# Patient Record
Sex: Male | Born: 1970 | Race: White | Hispanic: No | Marital: Married
Health system: Southern US, Community
[De-identification: ages and names within clinical notes are randomized; demographics above are authoritative.]

---

## 2018-01-18 ENCOUNTER — Encounter: Payer: Self-pay | Admitting: Emergency Medicine

## 2018-01-18 DIAGNOSIS — R300 Dysuria: Secondary | ICD-10-CM | POA: Diagnosis not present

## 2018-01-18 DIAGNOSIS — N200 Calculus of kidney: Secondary | ICD-10-CM | POA: Diagnosis not present

## 2018-01-18 DIAGNOSIS — R319 Hematuria, unspecified: Secondary | ICD-10-CM | POA: Diagnosis present

## 2018-01-18 DIAGNOSIS — R350 Frequency of micturition: Secondary | ICD-10-CM | POA: Diagnosis not present

## 2018-01-18 DIAGNOSIS — N390 Urinary tract infection, site not specified: Secondary | ICD-10-CM | POA: Insufficient documentation

## 2018-01-18 NOTE — ED Triage Notes (Signed)
Pt c/o hematuria, dysuria and frequent urination x1 week with bleeding starting today today.

## 2018-01-19 ENCOUNTER — Emergency Department: Payer: BLUE CROSS/BLUE SHIELD

## 2018-01-19 ENCOUNTER — Emergency Department
Admission: EM | Admit: 2018-01-19 | Discharge: 2018-01-19 | Disposition: A | Discharge status: Home / Self Care | Payer: BLUE CROSS/BLUE SHIELD | Attending: Emergency Medicine | Admitting: Emergency Medicine

## 2018-01-19 DIAGNOSIS — N390 Urinary tract infection, site not specified: Secondary | ICD-10-CM

## 2018-01-19 DIAGNOSIS — R319 Hematuria, unspecified: Secondary | ICD-10-CM

## 2018-01-19 DIAGNOSIS — N200 Calculus of kidney: Secondary | ICD-10-CM

## 2018-01-19 LAB — CBC
HEMATOCRIT: 44.8 % (ref 40.0–52.0)
HEMOGLOBIN: 15.9 g/dL (ref 13.0–18.0)
MCH: 31 pg (ref 26.0–34.0)
MCHC: 35.4 g/dL (ref 32.0–36.0)
MCV: 87.5 fL (ref 80.0–100.0)
Platelets: 315 10*3/uL (ref 150–440)
RBC: 5.12 MIL/uL (ref 4.40–5.90)
RDW: 12.8 % (ref 11.5–14.5)
WBC: 10.7 10*3/uL — AB (ref 3.8–10.6)

## 2018-01-19 LAB — BASIC METABOLIC PANEL
ANION GAP: 7 (ref 5–15)
BUN: 13 mg/dL (ref 6–20)
CO2: 24 mmol/L (ref 22–32)
Calcium: 9 mg/dL (ref 8.9–10.3)
Chloride: 106 mmol/L (ref 101–111)
Creatinine, Ser: 0.97 mg/dL (ref 0.61–1.24)
GFR calc non Af Amer: >60 mL/min (ref >60)
GLUCOSE: 112 mg/dL — AB (ref 65–99)
POTASSIUM: 3.8 mmol/L (ref 3.5–5.1)
Sodium: 137 mmol/L (ref 135–145)

## 2018-01-19 LAB — URINALYSIS, COMPLETE (UACMP) WITH MICROSCOPIC
Bilirubin Urine: NEGATIVE
GLUCOSE, UA: NEGATIVE mg/dL
Ketones, ur: NEGATIVE mg/dL
Leukocytes, UA: NEGATIVE
Nitrite: NEGATIVE
Protein, ur: 100 mg/dL — AB
Specific Gravity, Urine: 1.027 (ref 1.005–1.030)
pH: 6 (ref 5.0–8.0)

## 2018-01-19 MED ORDER — ONDANSETRON 4 MG PO TBDP: 4.0000 mg | ORAL_TABLET | Freq: Three times a day (TID) | ORAL | 0 refills | Status: AC | PRN

## 2018-01-19 MED ORDER — OXYCODONE-ACETAMINOPHEN 5-325 MG PO TABS: 1.0000 | ORAL_TABLET | ORAL | 0 refills | Status: AC | PRN

## 2018-01-19 MED ORDER — TAMSULOSIN HCL 0.4 MG PO CAPS: 0.4000 mg | ORAL_CAPSULE | Freq: Every day | ORAL | 0 refills | Status: AC

## 2018-01-19 MED ORDER — CIPROFLOXACIN HCL 500 MG PO TABS
500.0000 mg | ORAL_TABLET | Freq: Once | ORAL | Status: AC
  Administered 2018-01-19: 500 mg via ORAL
  Filled 2018-01-19: qty 1

## 2018-01-19 MED ORDER — CIPROFLOXACIN HCL 500 MG PO TABS: 500.0000 mg | ORAL_TABLET | Freq: Two times a day (BID) | ORAL | 0 refills | Status: AC

## 2018-01-19 NOTE — ED Provider Notes (Signed)
[facility] Emergency Department Provider Note   ____________________________________________   First MD Initiated Contact with Patient 01/19/18 0303     (approximate)  I have reviewed the triage vital signs and the nursing notes.   HISTORY  Chief Complaint Hematuria and Dysuria    HPI Billy Robbins is a 47 y.o. male who presents to the ED from home with a chief complaint of hematuria, dysuria and urinary frequency for the past week.  Patient has had similar symptoms previously; treated for prostatitis last fall.  Has had dysuria and frequency for the past week with bleeding which started today.  Denies blood clots.  Does not take anticoagulants.  Denies fever, chills, chest pain, shortness of breath, abdominal pain, nausea, vomiting.  Denies urethral discharge or concerns for STDs.  Denies recent travel or trauma.   Past medical history None  There are no active problems to display for this patient.   History reviewed. No pertinent surgical history.  Prior to Admission medications   Not on File    Allergies Patient has no known allergies.  History reviewed. No pertinent family history.  Social History Social History   Tobacco Use  . Smoking status: Not on file  Substance Use Topics  . Alcohol use: Not on file  . Drug use: Not on file  No recent alcohol use  Review of Systems  Constitutional: No fever/chills. Eyes: No visual changes. ENT: No sore throat. Cardiovascular: Denies chest pain. Respiratory: Denies shortness of breath. Gastrointestinal: No abdominal pain.  No nausea, no vomiting.  No diarrhea.  No constipation. Genitourinary: Positive for hematuria, frequency and dysuria. Musculoskeletal: Negative for back pain. Skin: Negative for rash. Neurological: Negative for headaches, focal weakness or numbness.   ____________________________________________   PHYSICAL EXAM:  VITAL SIGNS: ED Triage Vitals  Enc Vitals Group       BP 01/18/18 2350 134/83     Pulse Rate 01/18/18 2350 83     Resp 01/18/18 2350 16     Temp 01/18/18 2350 98.7 F (37.1 C)     Temp Source 01/18/18 2350 Oral     SpO2 01/18/18 2350 95 %     Weight 01/18/18 2340 220 lb (99.8 kg)     Height 01/18/18 2340 5\' 5"  (1.651 m)     Head Circumference --      Peak Flow --      Pain Score --      Pain Loc --      Pain Edu? --      Excl. in GC? --     Constitutional: Alert and oriented. Well appearing and in no acute distress. Eyes: Conjunctivae are normal. PERRL. EOMI. Head: Atraumatic. Nose: No congestion/rhinnorhea. Mouth/Throat: Mucous membranes are moist.  Oropharynx non-erythematous. Neck: No stridor.   Cardiovascular: Normal rate, regular rhythm. Grossly normal heart sounds.  Good peripheral circulation. Respiratory: Normal respiratory effort.  No retractions. Lungs CTAB. Gastrointestinal: Soft and nontender to light or deep palpation. No distention. No abdominal bruits. No CVA tenderness. Musculoskeletal: No lower extremity tenderness nor edema.  No joint effusions. Neurologic:  Normal speech and language. No gross focal neurologic deficits are appreciated. No gait instability. Skin:  Skin is warm, dry and intact. No rash noted. Psychiatric: Mood and affect are normal. Speech and behavior are normal.  ____________________________________________   LABS (all labs ordered are listed, but only abnormal results are displayed)  Labs Reviewed  URINALYSIS, COMPLETE (UACMP) WITH MICROSCOPIC - Abnormal; Notable for the following components:  Result Value   Color, Urine AMBER (*)    APPearance CLOUDY (*)    Hgb urine dipstick LARGE (*)    Protein, ur 100 (*)    Bacteria, UA RARE (*)    Squamous Epithelial / LPF 0-5 (*)    All other components within normal limits  BASIC METABOLIC PANEL - Abnormal; Notable for the following components:   Glucose, Bld 112 (*)    All other components within normal limits  CBC - Abnormal;  Notable for the following components:   WBC 10.7 (*)    All other components within normal limits   ____________________________________________  EKG  None ____________________________________________  RADIOLOGY  ED MD interpretation: 5 mm right ureteral stone without significant hydroureteronephrosis  Official radiology report(s): Ct Renal Stone Study  Result Date: 01/19/2018 CLINICAL DATA:  Initial evaluation for acute hematuria, unknown cause. EXAM: CT ABDOMEN AND PELVIS WITHOUT CONTRAST TECHNIQUE: Multidetector CT imaging of the abdomen and pelvis was performed following the standard protocol without IV contrast. COMPARISON:  None available. FINDINGS: Lower chest: Minimal scarring/atelectatic changes present within the lingula. Visualized lung bases are otherwise clear. Hepatobiliary: Limited noncontrast evaluation of the liver is unremarkable. Gallbladder normal. No biliary dilatation. Pancreas: Pancreas within normal limits. Spleen: Spleen within normal limits. Adrenals/Urinary Tract: Adrenal glands are normal. 5 mm stone seen lodged within the distal right ureter (series 2, image 73). Mild localized inflammatory stranding without significant hydronephrosis or hydroureter. No other radiopaque calculi seen within the right kidney or along the course of the right renal collecting system. No left-sided calculi identified. No left-sided hydronephrosis or hydroureter. Partially distended bladder within normal limits. No layering stones within the bladder lumen. Stomach/Bowel: Stomach within normal limits. No evidence for bowel obstruction. No acute inflammatory changes seen about the bowels. Vascular/Lymphatic: Intra-abdominal aorta of normal caliber. No adenopathy. Reproductive: Prostate within normal limits. Other: Fat containing left inguinal hernia without associated inflammation. Additional fat containing paraumbilical hernia noted as well. No free air or fluid. Musculoskeletal: No acute  osseus abnormality. No worrisome lytic or blastic osseous lesions. Moderate facet arthropathy noted within the lumbar spine. IMPRESSION: 1. 5 mm stone lodged within the distal right ureter without significant hydroureteronephrosis. No other radiopaque calculi identified. 2. No other acute intra-abdominal or pelvic process. 3. Fat containing paraumbilical and left inguinal hernias without associated inflammation. Electronically Signed   By: Rise Mu M.D.   On: 01/19/2018 04:03    ____________________________________________   PROCEDURES  Procedure(s) performed: None  Procedures  Critical Care performed: No  ____________________________________________   INITIAL IMPRESSION / ASSESSMENT AND PLAN / ED COURSE  As part of my medical decision making, I reviewed the following data within the electronic MEDICAL RECORD NUMBER Nursing notes reviewed and incorporated, Labs reviewed, Old chart reviewed, Radiograph reviewed, and Notes from prior ED visits   47 year old male who presents with hematuria, dysuria and frequency. Differential diagnosis includes, but is not limited to, acute appendicitis, renal colic, testicular torsion, urinary tract infection/pyelonephritis, prostatitis,  epididymitis, diverticulitis, small bowel obstruction or ileus, colitis, abdominal aortic aneurysm, gastroenteritis, hernia, etc.  Laboratory results unremarkable.  Urinalysis reveals TNTC RBC and WBC.  Will initiate antibiotic with Cipro and obtain CT renal colic study.  Patient without pain.  Clinical Course as of Jan 19 449  Sat Jan 19, 2018  8295 Updated patient on CT results.  Will discharge home on Percocet and Zofran as needed, Flomax daily x 14 days, Cipro twice daily x 7 days.  Follow-up with urology as needed.  Strict return precautions given.  Patient verbalizes understanding agrees with plan of care.   [JS]    Clinical Course User Index [JS] Irean Hong, MD      ____________________________________________   FINAL CLINICAL IMPRESSION(S) / ED DIAGNOSES  Final diagnoses:  Lower urinary tract infectious disease  Hematuria, unspecified type  Kidney stone     ED Discharge Orders    None       Note:  This document was prepared using Dragon voice recognition software and may include unintentional dictation errors.    Irean Hong, MD 01/19/18 [id number]

## 2018-01-19 NOTE — Discharge Instructions (Addendum)
1.  Take antibiotic as prescribed (Cipro 500 mg twice daily for 7 days). 2.  You may take medicines as needed for pain and nausea (Percocet/Zofran #30). 3.  Drink plenty of water filtered water daily. 4.  Take Flomax daily for 14 days. 5.  Return to the ER for worsening symptoms, persistent vomiting, difficulty breathing or other concerns.

## 2019-03-19 IMAGING — CT CT RENAL STONE PROTOCOL
2 of 4 series · 16 of 46 positions shown, 18 images · non-contrast
Comparison: None available.

CLINICAL DATA: Initial evaluation for acute hematuria, unknown
cause.

EXAM:
CT ABDOMEN AND PELVIS WITHOUT CONTRAST
TECHNIQUE: Multidetector CT imaging of the abdomen and pelvis was performed
following the standard protocol without IV contrast.

[Series 2: stone full standard · axial · 0.83mm/px · z∈[-870,-435]mm · 13 of 95 slices shown, 15 images]
[im 4/95  soft-tissue]
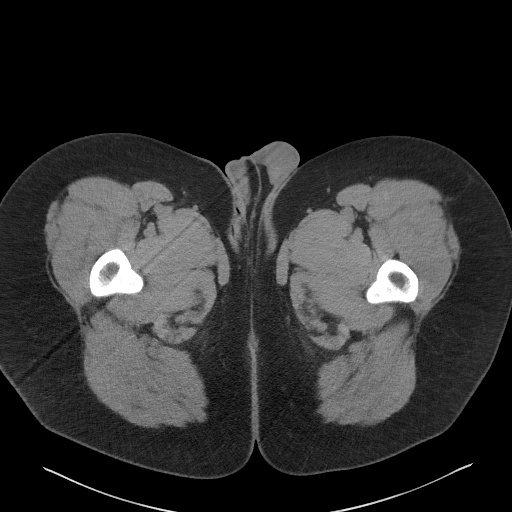
[im 4/95  bone]
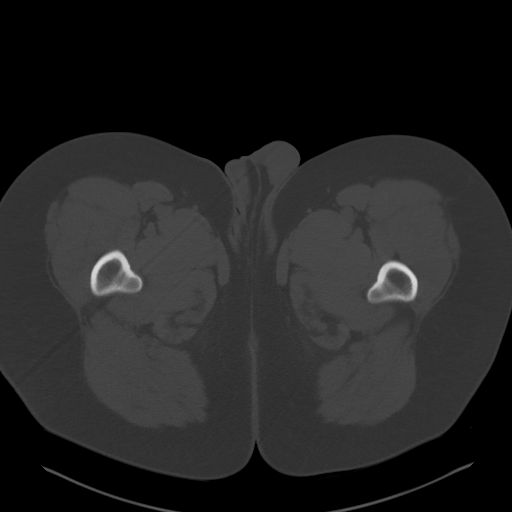
[im 12/95  soft-tissue]
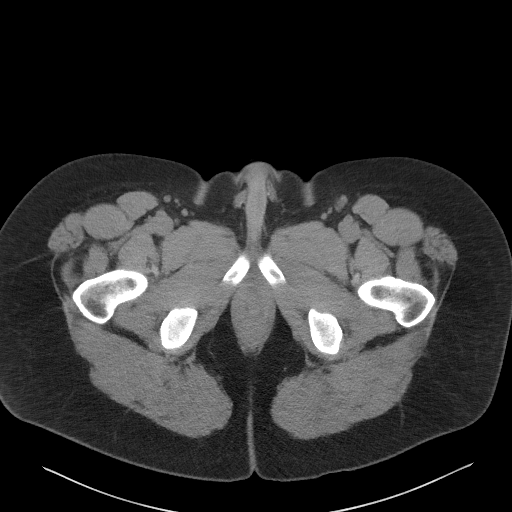
[im 20/95  soft-tissue]
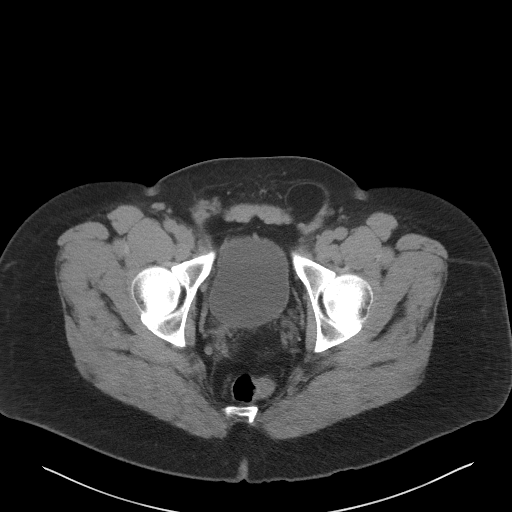
[im 28/95  soft-tissue]
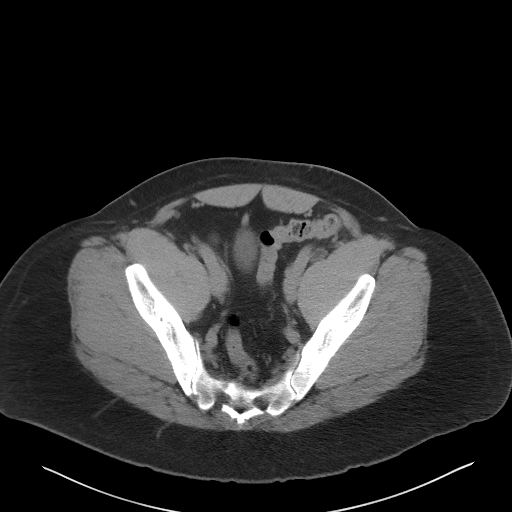
[im 32/95  soft-tissue]
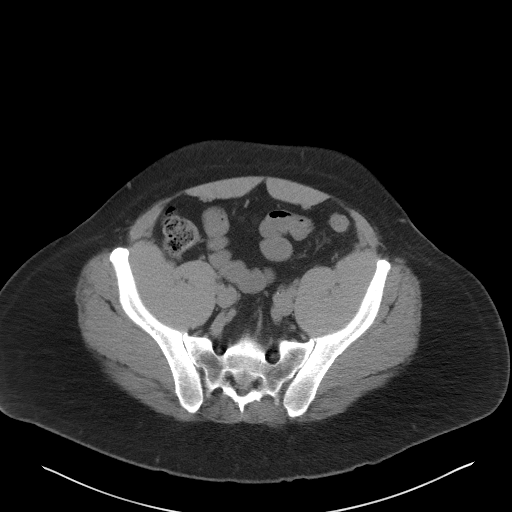
[im 40/95  soft-tissue]
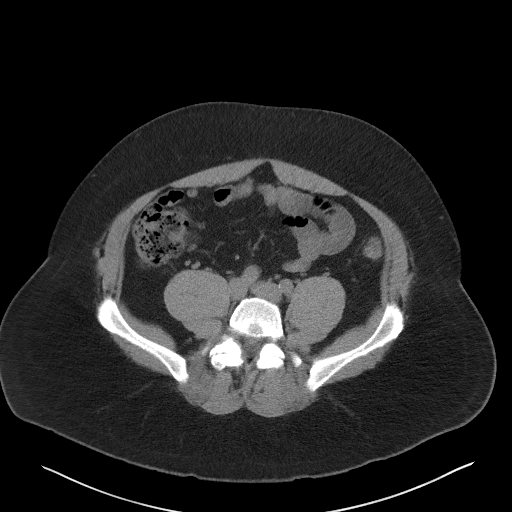
[im 48/95  soft-tissue]
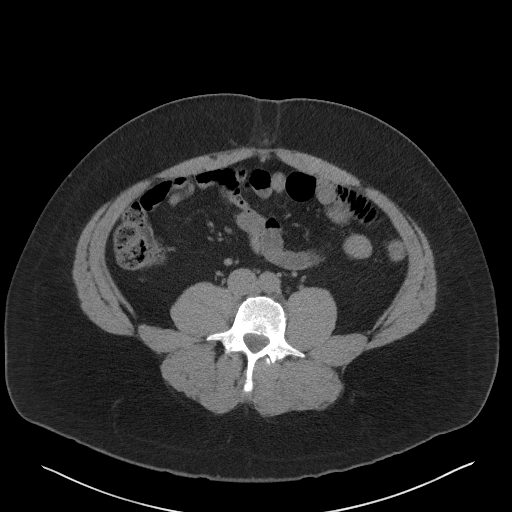
[im 55/95  soft-tissue]
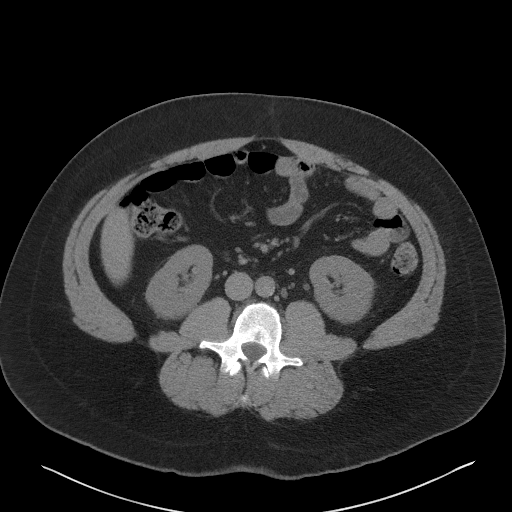
[im 63/95  soft-tissue]
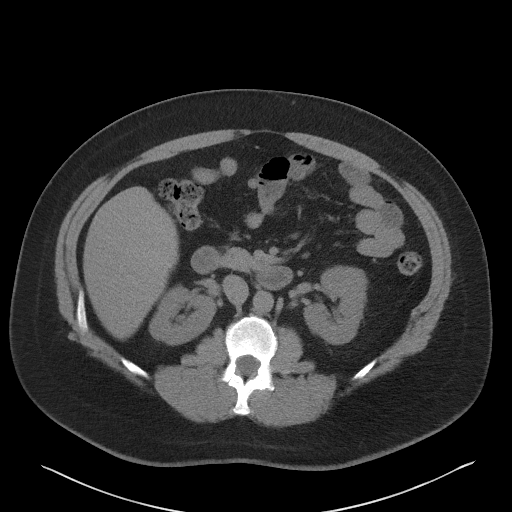
[im 63/95  bone]
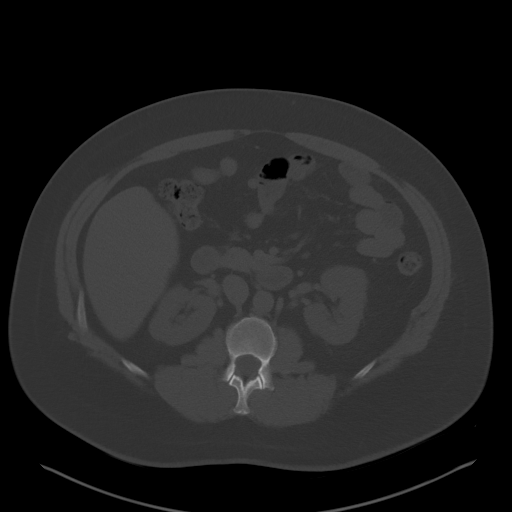
[im 67/95  soft-tissue]
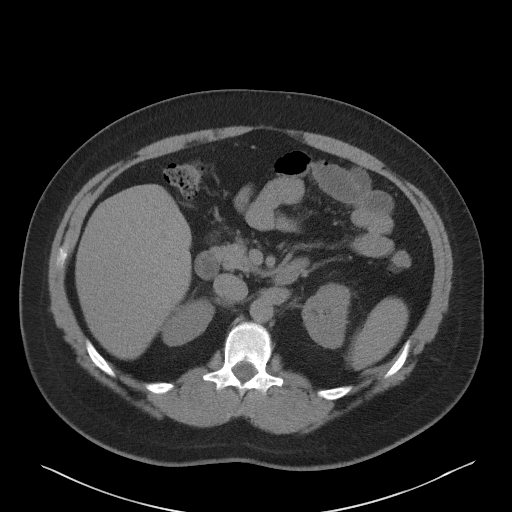
[im 75/95  soft-tissue]
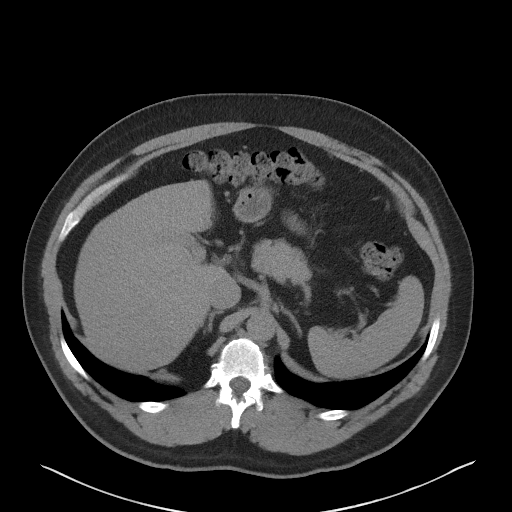
[im 83/95  soft-tissue]
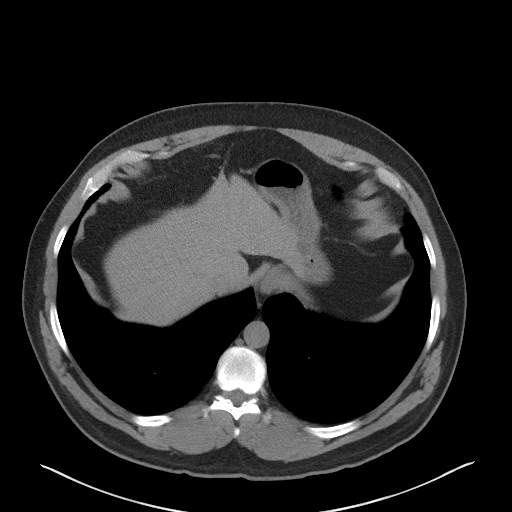
[im 91/95  soft-tissue]
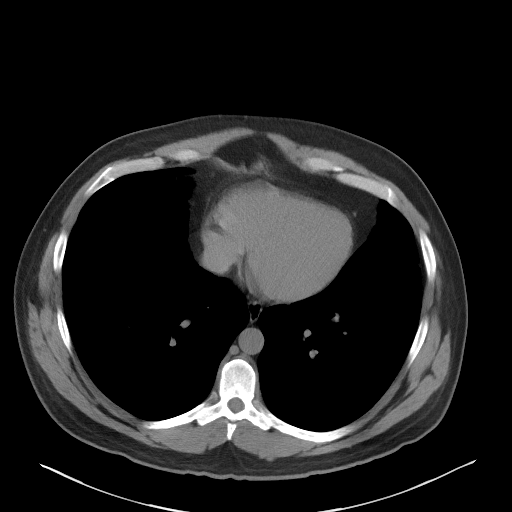

[Series 5: coronal · coronal · 0.74mm/px · 3 of 145 slices shown]
[im 49/145  soft-tissue]
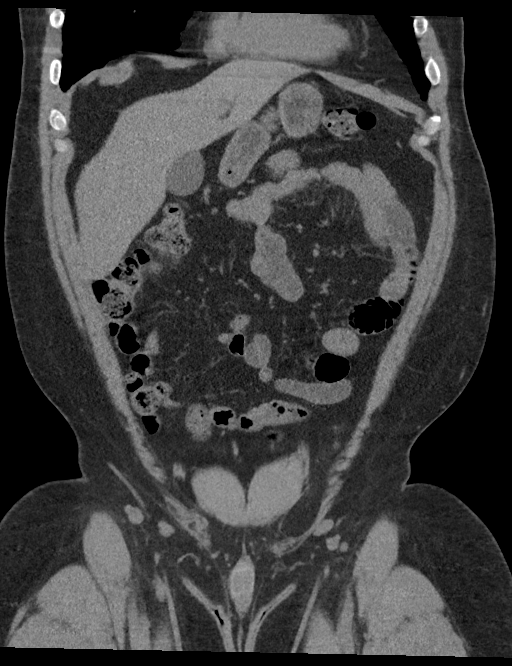
[im 65/145  soft-tissue]
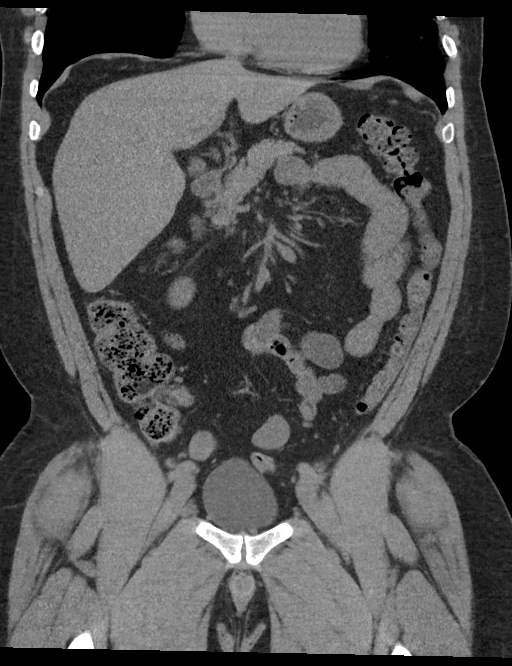
[im 81/145  soft-tissue]
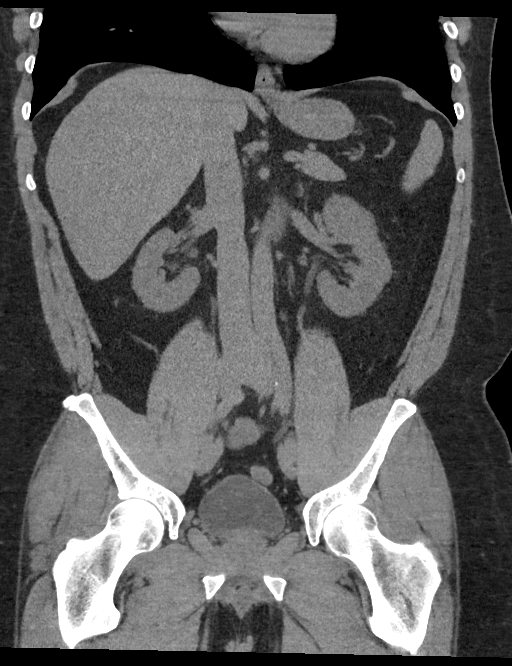

[16 of 46 positions shown; findings below may reference images not displayed]

FINDINGS: Lower chest: Minimal scarring/atelectatic changes present within the
lingula. Visualized lung bases are otherwise clear.

Hepatobiliary: Limited noncontrast evaluation of the liver is
unremarkable. Gallbladder normal. No biliary dilatation.

Pancreas: Pancreas within normal limits.

Spleen: Spleen within normal limits.

Adrenals/Urinary Tract: Adrenal glands are normal.

5 mm stone seen lodged within the distal right ureter (series 2,
image 73). Mild localized inflammatory stranding without significant
hydronephrosis or hydroureter. No other radiopaque calculi seen
within the right kidney or along the course of the right renal
collecting system. No left-sided calculi identified. No left-sided
hydronephrosis or hydroureter.

Partially distended bladder within normal limits. No layering stones
within the bladder lumen.

Stomach/Bowel: Stomach within normal limits. No evidence for bowel
obstruction. No acute inflammatory changes seen about the bowels.

Vascular/Lymphatic: Intra-abdominal aorta of normal caliber. No
adenopathy.

Reproductive: Prostate within normal limits.

Other: Fat containing left inguinal hernia without associated
inflammation. Additional fat containing paraumbilical hernia noted
as well. No free air or fluid.

Musculoskeletal: No acute osseus abnormality. No worrisome lytic or
blastic osseous lesions. Moderate facet arthropathy noted within the
lumbar spine.
IMPRESSION: 1. 5 mm stone lodged within the distal right ureter without
significant hydroureteronephrosis. No other radiopaque calculi
identified.
2. No other acute intra-abdominal or pelvic process.
3. Fat containing paraumbilical and left inguinal hernias without
associated inflammation.
